# Patient Record
Sex: Male | Born: 1990
Health system: Southern US, Community
[De-identification: ages and names within clinical notes are randomized; demographics above are authoritative.]

## PROBLEM LIST (undated history)

## (undated) HISTORY — PX: TONSILLECTOMY: SUR1361

---

## 2013-08-16 ENCOUNTER — Emergency Department (HOSPITAL_BASED_OUTPATIENT_CLINIC_OR_DEPARTMENT_OTHER): Payer: Self-pay

## 2013-08-16 ENCOUNTER — Emergency Department (HOSPITAL_BASED_OUTPATIENT_CLINIC_OR_DEPARTMENT_OTHER)
Admission: EM | Admit: 2013-08-16 | Discharge: 2013-08-16 | Disposition: A | Discharge status: Home / Self Care | Payer: Self-pay | Attending: Emergency Medicine | Admitting: Emergency Medicine

## 2013-08-16 ENCOUNTER — Encounter (HOSPITAL_BASED_OUTPATIENT_CLINIC_OR_DEPARTMENT_OTHER): Payer: Self-pay | Admitting: Emergency Medicine

## 2013-08-16 DIAGNOSIS — N50812 Left testicular pain: Secondary | ICD-10-CM

## 2013-08-16 DIAGNOSIS — K5289 Other specified noninfective gastroenteritis and colitis: Secondary | ICD-10-CM | POA: Insufficient documentation

## 2013-08-16 DIAGNOSIS — K6289 Other specified diseases of anus and rectum: Secondary | ICD-10-CM | POA: Insufficient documentation

## 2013-08-16 DIAGNOSIS — K529 Noninfective gastroenteritis and colitis, unspecified: Secondary | ICD-10-CM

## 2013-08-16 LAB — COMPREHENSIVE METABOLIC PANEL
ALT: 17 U/L (ref 0–53)
AST: 19 U/L (ref 0–37)
Albumin: 4.2 g/dL (ref 3.5–5.2)
Alkaline Phosphatase: 48 U/L (ref 39–117)
Anion gap: 17 — ABNORMAL HIGH (ref 5–15)
BILIRUBIN TOTAL: 0.3 mg/dL (ref 0.3–1.2)
BUN: 7 mg/dL (ref 6–23)
CHLORIDE: 98 meq/L (ref 96–112)
CO2: 26 meq/L (ref 19–32)
CREATININE: 0.7 mg/dL (ref 0.50–1.35)
Calcium: 9.9 mg/dL (ref 8.4–10.5)
GFR calc Af Amer: >90 mL/min (ref >90)
Glucose, Bld: 130 mg/dL — ABNORMAL HIGH (ref 70–99)
Potassium: 4 mEq/L (ref 3.7–5.3)
Sodium: 141 mEq/L (ref 137–147)
Total Protein: 7.8 g/dL (ref 6.0–8.3)

## 2013-08-16 LAB — CBC WITH DIFFERENTIAL/PLATELET
Basophils Absolute: 0 10*3/uL (ref 0.0–0.1)
Basophils Relative: 0 % (ref 0–1)
Eosinophils Absolute: 0 10*3/uL (ref 0.0–0.7)
Eosinophils Relative: 0 % (ref 0–5)
HEMATOCRIT: 43.2 % (ref 39.0–52.0)
HEMOGLOBIN: 14.4 g/dL (ref 13.0–17.0)
LYMPHS ABS: 1.3 10*3/uL (ref 0.7–4.0)
LYMPHS PCT: 17 % (ref 12–46)
MCH: 30.8 pg (ref 26.0–34.0)
MCHC: 33.3 g/dL (ref 30.0–36.0)
MCV: 92.3 fL (ref 78.0–100.0)
MONO ABS: 1 10*3/uL (ref 0.1–1.0)
Monocytes Relative: 13 % — ABNORMAL HIGH (ref 3–12)
Neutro Abs: 5.3 10*3/uL (ref 1.7–7.7)
Neutrophils Relative %: 69 % (ref 43–77)
Platelets: 161 10*3/uL (ref 150–400)
RBC: 4.68 MIL/uL (ref 4.22–5.81)
RDW: 12.6 % (ref 11.5–15.5)
WBC: 7.7 10*3/uL (ref 4.0–10.5)

## 2013-08-16 LAB — URINALYSIS, ROUTINE W REFLEX MICROSCOPIC
Bilirubin Urine: NEGATIVE
Glucose, UA: NEGATIVE mg/dL
HGB URINE DIPSTICK: NEGATIVE
Ketones, ur: NEGATIVE mg/dL
Leukocytes, UA: NEGATIVE
Nitrite: NEGATIVE
PH: 8.5 — AB (ref 5.0–8.0)
PROTEIN: NEGATIVE mg/dL
Specific Gravity, Urine: 1.006 (ref 1.005–1.030)
Urobilinogen, UA: 0.2 mg/dL (ref 0.0–1.0)

## 2013-08-16 LAB — I-STAT CG4 LACTIC ACID, ED: Lactic Acid, Venous: 1.78 mmol/L (ref 0.5–2.2)

## 2013-08-16 LAB — LIPASE, BLOOD: Lipase: 33 U/L (ref 11–59)

## 2013-08-16 MED ORDER — SODIUM CHLORIDE 0.9 % IV BOLUS (SEPSIS)
1000.0000 mL | Freq: Once | INTRAVENOUS | Status: AC
  Administered 2013-08-16: 1000 mL via INTRAVENOUS

## 2013-08-16 MED ORDER — MORPHINE SULFATE 4 MG/ML IJ SOLN
4.0000 mg | Freq: Once | INTRAMUSCULAR | Status: AC
  Administered 2013-08-16: 4 mg via INTRAVENOUS
  Filled 2013-08-16: qty 1

## 2013-08-16 MED ORDER — HYDROMORPHONE HCL PF 1 MG/ML IJ SOLN
0.5000 mg | Freq: Once | INTRAMUSCULAR | Status: AC
  Administered 2013-08-16: 0.5 mg via INTRAVENOUS
  Filled 2013-08-16: qty 1

## 2013-08-16 MED ORDER — IOHEXOL 300 MG/ML  SOLN
100.0000 mL | Freq: Once | INTRAMUSCULAR | Status: AC | PRN
  Administered 2013-08-16: 100 mL via INTRAVENOUS

## 2013-08-16 MED ORDER — DOCUSATE SODIUM 100 MG PO CAPS: 100.0000 mg | ORAL_CAPSULE | Freq: Two times a day (BID) | ORAL | Status: DC

## 2013-08-16 MED ORDER — METRONIDAZOLE 500 MG PO TABS: 500.0000 mg | ORAL_TABLET | Freq: Two times a day (BID) | ORAL | Status: DC

## 2013-08-16 MED ORDER — OXYCODONE-ACETAMINOPHEN 5-325 MG PO TABS: 1.0000 | ORAL_TABLET | Freq: Four times a day (QID) | ORAL | Status: DC | PRN

## 2013-08-16 MED ORDER — CIPROFLOXACIN HCL 500 MG PO TABS: 500.0000 mg | ORAL_TABLET | Freq: Two times a day (BID) | ORAL | Status: DC

## 2013-08-16 MED ORDER — IOHEXOL 300 MG/ML  SOLN
50.0000 mL | Freq: Once | INTRAMUSCULAR | Status: AC | PRN
  Administered 2013-08-16: 50 mL via ORAL

## 2013-08-16 MED ORDER — ONDANSETRON HCL 4 MG/2ML IJ SOLN
4.0000 mg | Freq: Once | INTRAMUSCULAR | Status: AC
  Administered 2013-08-16: 4 mg via INTRAVENOUS
  Filled 2013-08-16: qty 2

## 2013-08-16 MED ORDER — PROMETHAZINE HCL 25 MG PO TABS: 25.0000 mg | ORAL_TABLET | Freq: Four times a day (QID) | ORAL | Status: DC | PRN

## 2013-08-16 NOTE — ED Notes (Signed)
Pt requesting something for "the shakes." Informed pt that he would have to wait on that medication. Pt transported to [facility] at this time.

## 2013-08-16 NOTE — ED Notes (Signed)
Pt sts that pain is 0/10 with no movement but 5/10 with movement.

## 2013-08-16 NOTE — ED Notes (Signed)
Reports fever since Sunday. Sts left testicle pain with some swelling. Some n/v. Last BM was Monday, diarrhea.

## 2013-08-16 NOTE — ED Provider Notes (Signed)
TIME SEEN: 9:22 AM  CHIEF COMPLAINT: Abdominal pain, testicular pain, nausea, diarrhea, fever  HPI: Patient is a 23 year old male with a significant past medical history and no history of prior abdominal surgeries who presents the emergency department with fever as high as 103 for 4 days, nausea and diarrhea. Denies vomiting.  He states 2 days ago he started having pain in his left testicle and perineum. He feels that the area is swollen. Denies a history of trauma to this area. He has had pain with bowel movements.  No bloody stool or melena. No dysuria or hematuria. No penile discharge. He is sexual active with one partner. Denies concern for STDs. No recent travel, antibiotic use or hospitalization. No sick contacts. No history of inflammatory bowel disease or family history of the same.  ROS: See HPI Constitutional:  fever  Eyes: no drainage  ENT: no runny nose   Cardiovascular:  no chest pain  Resp: no SOB  GI: no vomiting GU: no dysuria Integumentary: no rash  Allergy: no hives  Musculoskeletal: no leg swelling  Neurological: no slurred speech ROS otherwise negative  PAST MEDICAL HISTORY/PAST SURGICAL HISTORY:  History reviewed. No pertinent past medical history.  MEDICATIONS:  Prior to Admission medications   Not on File    ALLERGIES:  No Known Allergies  SOCIAL HISTORY:  History  Substance Use Topics  . Smoking status: Never Smoker   . Smokeless tobacco: Not on file  . Alcohol Use: Yes     Comment: occ    FAMILY HISTORY: No family history on file.  EXAM: BP 144/83  Pulse 65  Temp(Src) 98 F (36.7 C) (Oral)  Resp 18  Ht 6\' 2"  (1.88 m)  Wt 170 lb (77.111 kg)  BMI 21.82 kg/m2  SpO2 100% CONSTITUTIONAL: Alert and oriented and responds appropriately to questions. Well-appearing; well-nourished, patient is nontoxic but does appear very uncomfortable HEAD: Normocephalic EYES: Conjunctivae clear, PERRL ENT: normal nose; no rhinorrhea; moist mucous membranes;  pharynx without lesions noted NECK: Supple, no meningismus, no LAD  CARD: RRR; S1 and S2 appreciated; no murmurs, no clicks, no rubs, no gallops RESP: Normal chest excursion without splinting or tachypnea; breath sounds clear and equal bilaterally; no wheezes, no rhonchi, no rales,  ABD/GI: Normal bowel sounds; non-distended; soft, diffusely tender to palpation with voluntary guarding diffusely, no rebound, most tenderness is in the lower abdomen but no tenderness at McBurney's point that is not significant compared to the rest of the abdomen, no peritoneal signs GU:  No testicular swelling or masses, patient does have mild pain over his left testicle but most of his pain is in the perineum without obvious  swelling or induration or warmth or crepitus, no scrotal masses, no hernia, 2+ femoral pulses bilaterally, no penile discharge BACK:  The back appears normal and is non-tender to palpation, there is no CVA tenderness EXT: Normal ROM in all joints; non-tender to palpation; no edema; normal capillary refill; no cyanosis    SKIN: Normal color for age and race; warm NEURO: Moves all extremities equally PSYCH: The patient's mood and manner are appropriate. Grooming and personal hygiene are appropriate.  MEDICAL DECISION MAKING: Patient here with fever, abdominal pain, nausea, diarrhea and perineal pain. Differential diagnosis includes colitis, abscess, viral gastroenteritis, UTI, less likely torsion, epididymitis, less likely Fournier's gangrene. Given he is tender to palpation diffusely throughout his abdomen and perineum, I feel he needs a CT scan for further evaluation. Given he will need prolonged time to drink contrast to  fully evaluate his bowel, will obtain a scrotal ultrasound first to rule out torsion given there is some left testicular pain although less likely given most of his pain is in his abdomen and perineum. We'll check abdominal labs, urine. Will give pain medication, nausea medicine and  IV fluids.  ED PROGRESS: Patient's labs, urine and testicular ultrasound are negative other than bilateral varicoceles. He is still complaining of pain in his perineum but this is improved after morphine and Dilaudid.  Discussed with patient that given work up so far has been negative, his CT may also be normal and this may be viral gastroenteritis but given he is still having significant abdominal pain and perineal pain on exam and that he has not required 3 doses of narcotics to control his pain,  I plan to still proceed with a CT scan.   12:24 PM  Pt's CT scan shows sigmoid colitis and proctitis. Given he has had fever, will treat as if this is infectious with Cipro and Flagyl for 10 days. Have discussed with him that he will likely need a colonoscopy for further evaluation once his symptoms have resolved. We'll discharge with low power and equal gastroenterology followup urination. Given his pain is well controlled currently and he is hemodynamically stable with normal labs and has been able to tolerate by mouth, I feel he is safe to be discharged home on oral antibiotics. We'll also discharge with Percocet, Zofran and Colace. Patient and her significant other bedside are comfortable with this plan and verbalized understanding. Have discussed return precautions.  Layla Maw Sahithi Ordoyne, DO 08/16/13 1226

## 2013-08-16 NOTE — Discharge Instructions (Signed)
Colitis Colitis is inflammation of the colon. Colitis can be a short-term or long-standing (chronic) illness. Crohn's disease and ulcerative colitis are 2 types of colitis which are chronic. They usually require lifelong treatment. CAUSES  There are many different causes of colitis, including:  Viruses.  Germs (bacteria).  Medicine reactions. SYMPTOMS   Diarrhea.  Intestinal bleeding.  Pain.  Fever.  Throwing up (vomiting).  Tiredness (fatigue).  Weight loss.  Bowel blockage. DIAGNOSIS  The diagnosis of colitis is based on examination and stool or blood tests. X-rays, CT scan, and colonoscopy may also be needed. TREATMENT  Treatment may include:  Fluids given through the vein (intravenously).  Bowel rest (nothing to eat or drink for a period of time).  Medicine for pain and diarrhea.  Medicines (antibiotics) that kill germs.  Cortisone medicines.  Surgery. HOME CARE INSTRUCTIONS   Get plenty of rest.  Drink enough water and fluids to keep your urine clear or pale yellow.  Eat a well-balanced diet.  Call your caregiver for follow-up as recommended. SEEK IMMEDIATE MEDICAL CARE IF:   You develop chills.  You have an oral temperature above 102 F (38.9 C), not controlled by medicine.  You have extreme weakness, fainting, or dehydration.  You have repeated vomiting.  You develop severe belly (abdominal) pain or are passing bloody or tarry stools. MAKE SURE YOU:   Understand these instructions.  Will watch your condition.  Will get help right away if you are not doing well or get worse. Document Released: 02/19/2004 Document Revised: 04/05/2011 Document Reviewed: 05/16/2009 [facility] Patient Information 2015 [place], [place]. This information is not intended to replace advice given to you by your health care provider. Make sure you discuss any questions you have with your health care provider.  Proctitis Proctitis is the swelling and soreness  (inflammation) of the lining of the rectum. The rectum is at the end of the large intestine and is attached to the anus. The inflammation causes pain and discomfort. It may be short-term (acute) or long-lasting (chronic). CAUSES Inflammation in the rectum can be caused by many things, such as:  Sexually transmitted diseases (STDs).  Infection.  Anal-rectal trauma or injury.  Ulcerative colitis or Crohn's disease.  Radiation therapy directed near the rectum.  Antibiotic therapy. SYMPTOMS  Sudden, uncomfortable, and frequent urge to have a bowel movement.  Anal or rectal pain.  Abdominal cramping or pain.  Sensation that the rectum is full.  Rectal bleeding.  Pus or mucus discharge from anus.  Diarrhea or frequent soft, loose stools. DIAGNOSIS Diagnosis may include the following:  A history and physical exam.  An STD test.  Blood tests.  Stool tests.  Rectal culture.  A procedure to evaluate the anal canal (anoscopy).  Procedures to look at part, or the entire large bowel (sigmoidoscopy, colonoscopy). TREATMENT Treatment of proctitis depends on the cause. Reducing the symptoms of inflammation and eliminating infection are the main goals of treatment. Treatment may include:  Home remedies and lifestyle, such as sitz baths and avoiding food right before bedtime.  Topical ointments, foams, suppositories, or enemas, such as corticosteroids or anti-inflammatories.  Antibiotic or antiviral medicines to treat infection or to control harmful bacteria.  Medicines to control diarrhea, soften stools, and reduce pain.  Medicines to suppress the immune system.  Avoiding the activity that caused rectal trauma.  Nutritional, dietary, or herbal supplements.  Heat or laser therapy for persistent bleeding.  A dilation procedure to enlarge a narrowed rectum.  Surgery, though rare, may  be necessary to repair damaged rectal lining. HOME CARE INSTRUCTIONS Only take  medicines that are recommended or approved by your caregiver.Do not take anti-diarrhea medicine without your caregiver's approval. SEEK MEDICAL CARE IF:  You often experience one or more of the symptoms noted above.  You keep experiencing symptoms after treatment.  You have questions or concerns about your symptoms or treatment plan. MAKE SURE YOU:  Understand these instructions.  Will watch your condition.  Will get help right away if you are not doing well or get worse. FOR MORE INFORMATION National Institute of Diabetes and Digestive and Kidney Disease (NIDDK): www.digestive.https://bradley.com/niddk.hih.gov/ Document Released: 12/31/2010 Document Revised: 05/08/2012 Document Reviewed: 12/31/2010 [facility] Patient Information 2015 [place]. This information is not intended to replace advice given to you by your health care provider. Make sure you discuss any questions you have with your health care provider.

## 2015-04-11 IMAGING — CT CT ABD-PELV W/ CM
2 of 4 series · 15 of 46 positions shown, 17 images · IV contrast (APPLIED)
Comparison: No prior CT. Scrotal ultrasound performed earlier same
date is correlated.

CLINICAL DATA: Abdominal pain and pain localizing to the perineum
for the past 2 days. Fever. Nausea.

EXAM:
CT ABDOMEN AND PELVIS WITH CONTRAST
TECHNIQUE: Multidetector CT imaging of the abdomen and pelvis was performed
using the standard protocol following bolus administration of
intravenous contrast.
CONTRAST:  100mL OMNIPAQUE IOHEXOL 300 MG/ML IV. Oral contrast was
also administered.

[Series 2: abd/pelvis 5.0 b31f · axial · 0.75mm/px · z∈[-582,-72]mm · 12 of 112 slices shown, 14 images]
[im 5/112  soft-tissue]
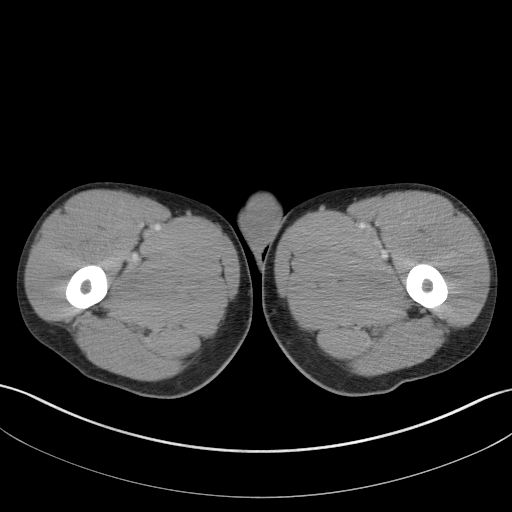
[im 5/112  bone]
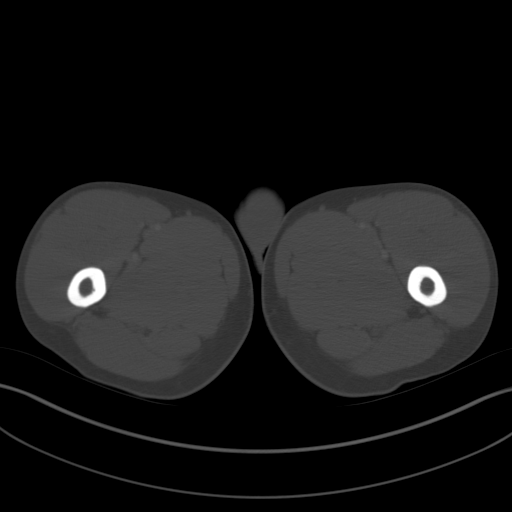
[im 14/112  soft-tissue]
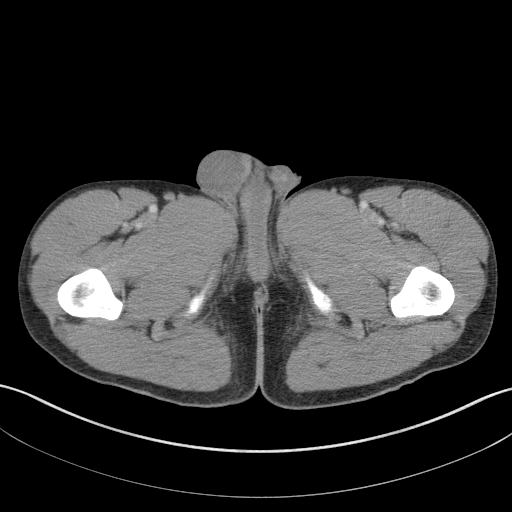
[im 24/112  soft-tissue]
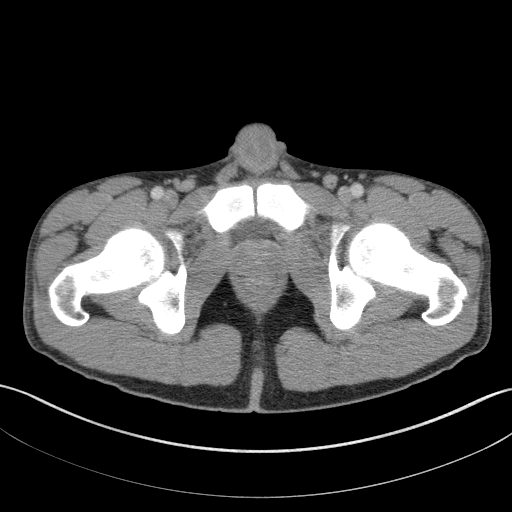
[im 33/112  soft-tissue]
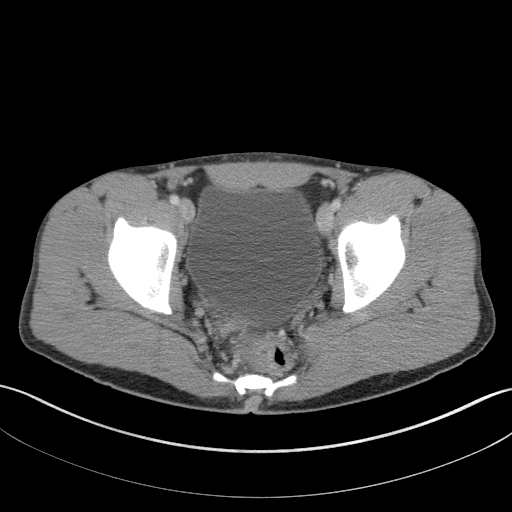
[im 42/112  soft-tissue]
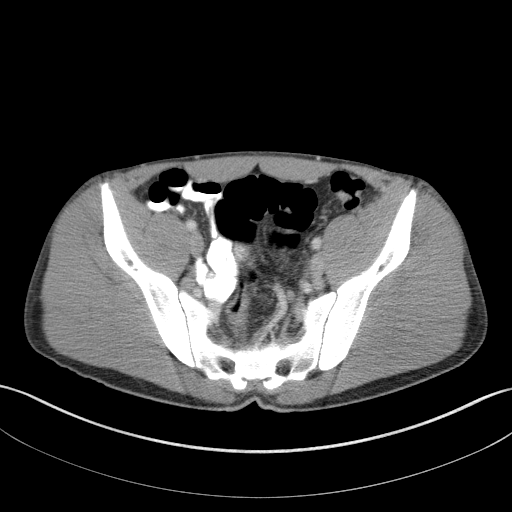
[im 51/112  soft-tissue]
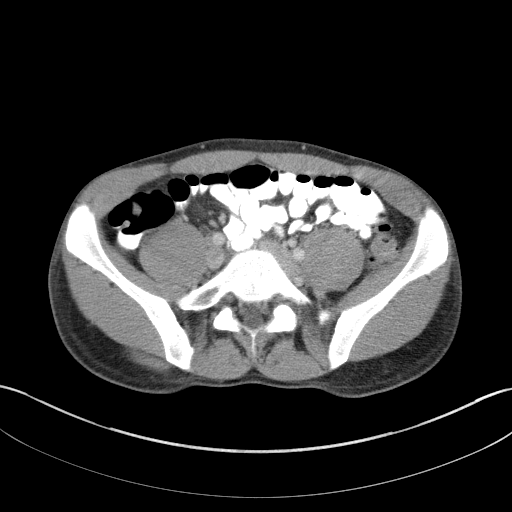
[im 61/112  soft-tissue]
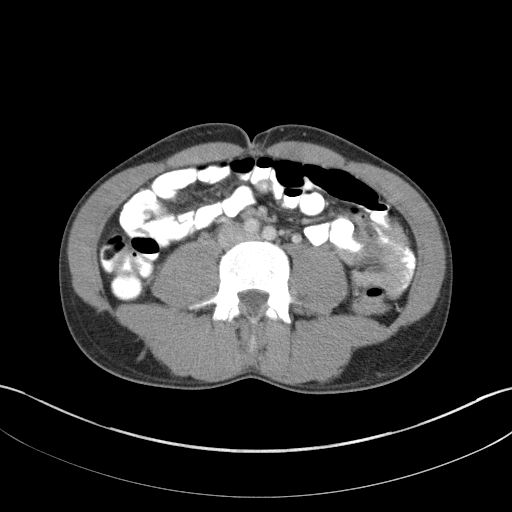
[im 70/112  soft-tissue]
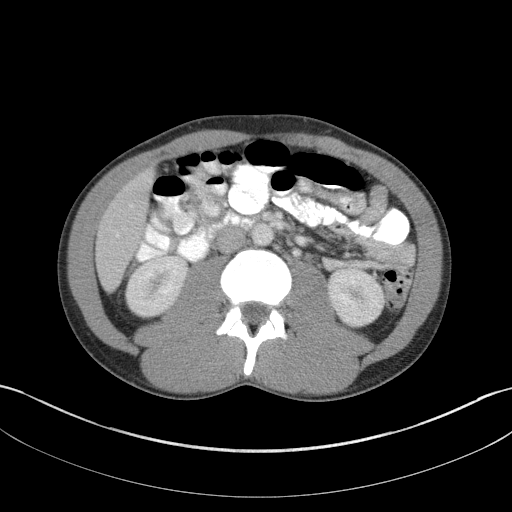
[im 79/112  soft-tissue]
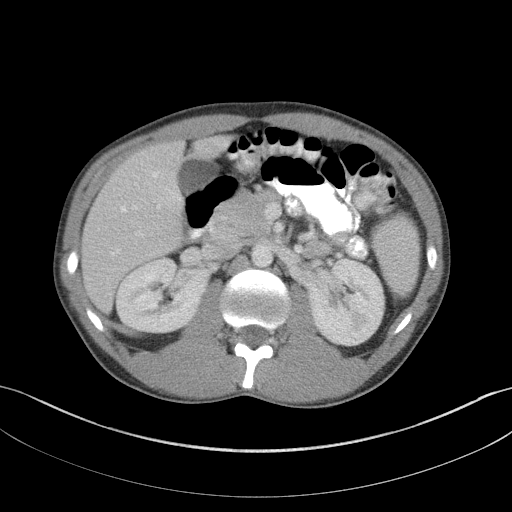
[im 79/112  bone]
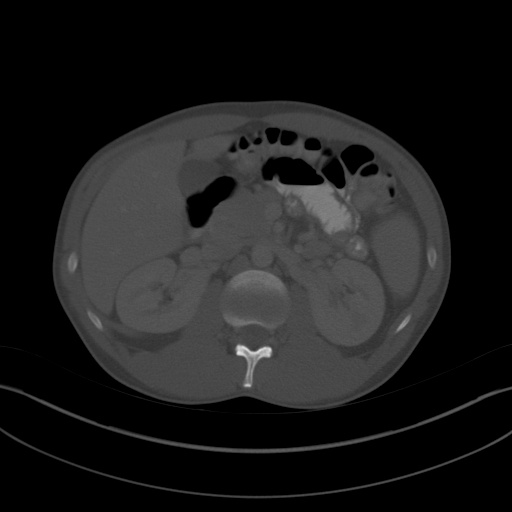
[im 88/112  soft-tissue]
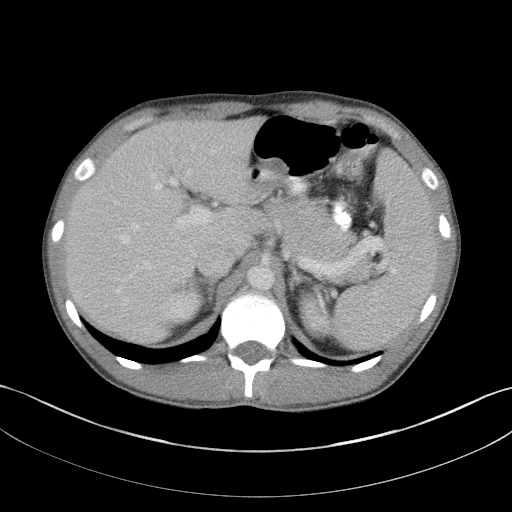
[im 98/112  soft-tissue]
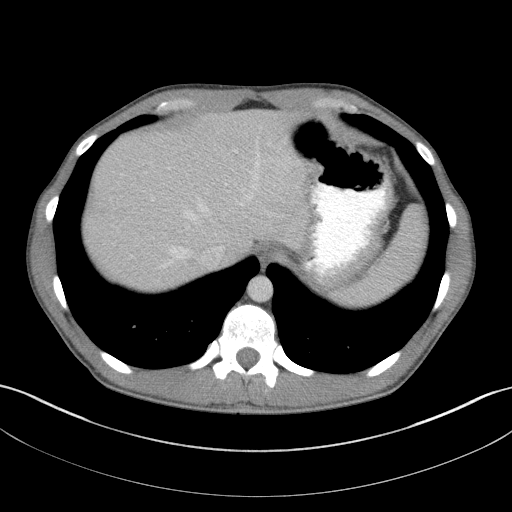
[im 107/112  soft-tissue]
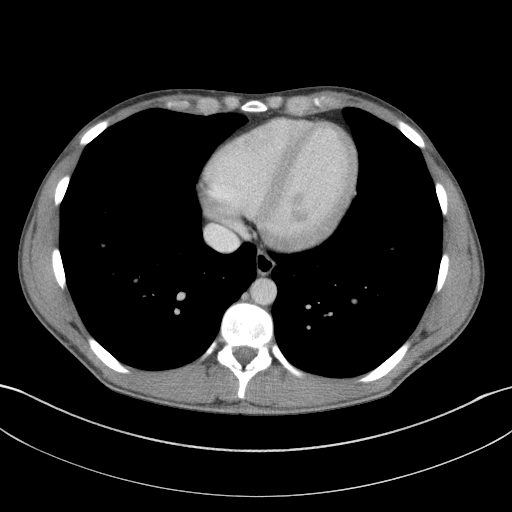

[Series 5: abd/pelvis 3.0 coronal · coronal · 0.85mm/px · 3 of 82 slices shown]
[im 28/82  soft-tissue]
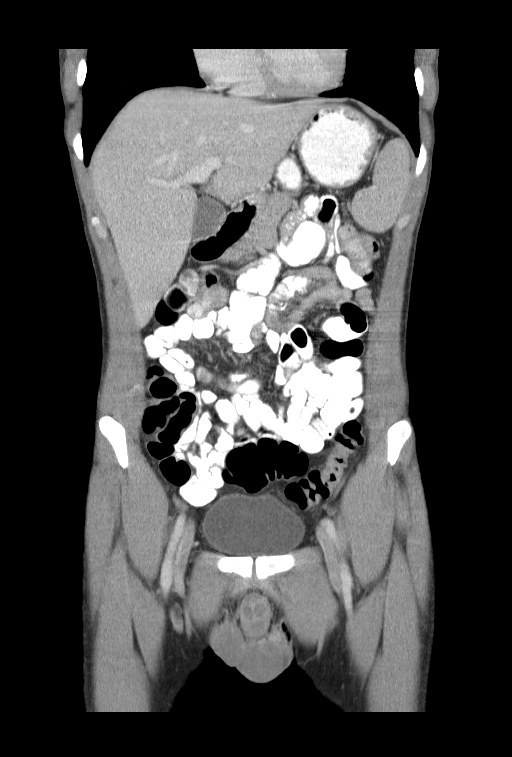
[im 37/82  soft-tissue]
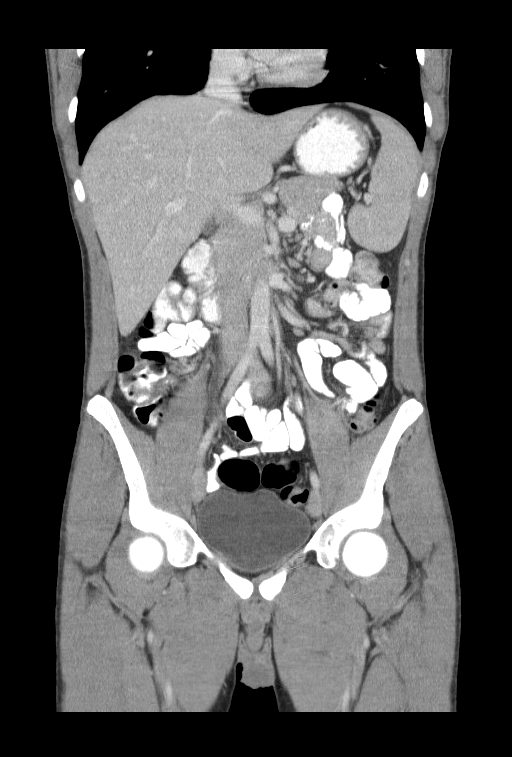
[im 46/82  soft-tissue]
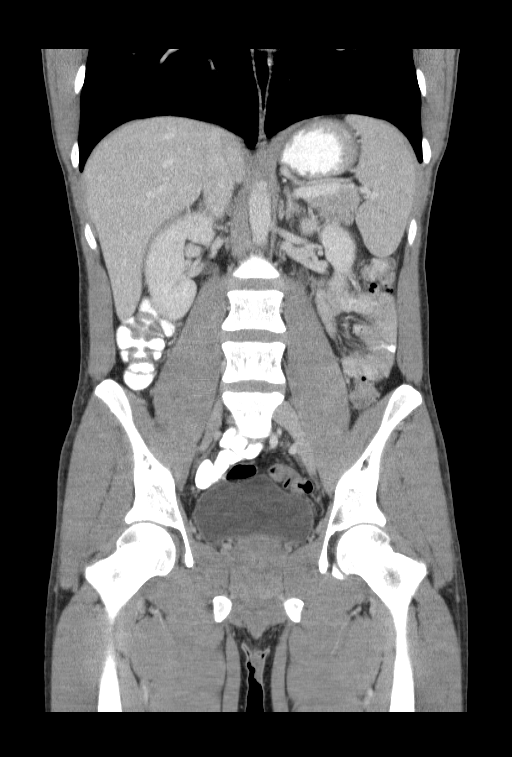

[15 of 46 positions shown; findings below may reference images not displayed]

FINDINGS: Normal appendix in the right upper pelvis, filled with oral
contrast. Edema/thickening of the wall of the entire rectum and the
distal sigmoid colon without evidence of obstruction. Remainder of
the colon relatively decompressed and normal in appearance,
containing liquid stool. Normal-appearing small bowel. Normal
appearing stomach. No ascites. No abscess.

Normal appearing liver, spleen, pancreas, adrenal glands, kidneys,
and gallbladder. 2 adjacent foci of accessory splenic tissue
posterior to the lower pole of the spleen. No biliary ductal
dilation. Normal-appearing abdominal aorta and iliofemoral arteries.
Patent visceral arteries. Enlarged inferior mesenteric vein with
probable internal hemorrhoids involving the rectum. No significant
lymphadenopathy.

Urinary bladder normal in appearance. Prostate gland and seminal
vesicles normal for age.

Bone window images demonstrate and assimilation joint between the
right transverse process of L5 and the 1st sacral segment, with
early degenerative changes. Otherwise, bone window images
unremarkable. Visualized lung bases clear. Heart size normal.
IMPRESSION: 1. Proctitis and distal sigmoid colitis. Probable internal
hemorrhoids involving the rectum. The remainder of the
gastrointestinal tract is normal in appearance.
2. No acute or significant abnormalities otherwise.

## 2015-04-30 ENCOUNTER — Encounter (HOSPITAL_BASED_OUTPATIENT_CLINIC_OR_DEPARTMENT_OTHER): Payer: Self-pay

## 2015-04-30 ENCOUNTER — Emergency Department (HOSPITAL_BASED_OUTPATIENT_CLINIC_OR_DEPARTMENT_OTHER)
Admission: EM | Admit: 2015-04-30 | Discharge: 2015-04-30 | Disposition: A | Discharge status: Home / Self Care | Payer: Self-pay | Source: Home / Self Care | Attending: Emergency Medicine | Admitting: Emergency Medicine

## 2015-04-30 DIAGNOSIS — M62838 Other muscle spasm: Secondary | ICD-10-CM

## 2015-04-30 DIAGNOSIS — M542 Cervicalgia: Secondary | ICD-10-CM

## 2015-04-30 DIAGNOSIS — F17210 Nicotine dependence, cigarettes, uncomplicated: Secondary | ICD-10-CM | POA: Insufficient documentation

## 2015-04-30 DIAGNOSIS — M25511 Pain in right shoulder: Secondary | ICD-10-CM | POA: Insufficient documentation

## 2015-04-30 MED ORDER — CYCLOBENZAPRINE HCL 10 MG PO TABS: 10.0000 mg | ORAL_TABLET | Freq: Two times a day (BID) | ORAL | Status: AC | PRN

## 2015-04-30 MED ORDER — NAPROXEN 250 MG PO TABS
ORAL_TABLET | ORAL | Status: AC
  Filled 2015-04-30: qty 2

## 2015-04-30 MED ORDER — NAPROXEN 500 MG PO TABS
500.0000 mg | ORAL_TABLET | Freq: Two times a day (BID) | ORAL | Status: AC
Start: 2015-04-30 — End: ?

## 2015-04-30 MED ORDER — NAPROXEN 250 MG PO TABS
500.0000 mg | ORAL_TABLET | Freq: Once | ORAL | Status: AC
  Administered 2015-04-30: 500 mg via ORAL

## 2015-04-30 NOTE — ED Notes (Signed)
Woke approx 1 week ago with posterior neck pain-denies injury-NAD

## 2015-04-30 NOTE — Discharge Instructions (Signed)
Heat Therapy °Heat therapy can help ease sore, stiff, injured, and tight muscles and joints. Heat relaxes your muscles, which may help ease your pain.  °RISKS AND COMPLICATIONS °If you have any of the following conditions, do not use heat therapy unless your health care provider has approved: °· Poor circulation. °· Healing wounds or scarred skin in the area being treated. °· Diabetes, heart disease, or high blood pressure. °· Not being able to feel (numbness) the area being treated. °· Unusual swelling of the area being treated. °· Active infections. °· Blood clots. °· Cancer. °· Inability to communicate pain. This may include young children and people who have problems with their brain function (dementia). °· Pregnancy. °Heat therapy should only be used on old, pre-existing, or long-lasting (chronic) injuries. Do not use heat therapy on new injuries unless directed by your health care provider. °HOW TO USE HEAT THERAPY °There are several different kinds of heat therapy, including: °· Moist heat pack. °· Warm water bath. °· Hot water bottle. °· Electric heating pad. °· Heated gel pack. °· Heated wrap. °· Electric heating pad. °Use the heat therapy method suggested by your health care provider. Follow your health care provider's instructions on when and how to use heat therapy. °GENERAL HEAT THERAPY RECOMMENDATIONS °· Do not sleep while using heat therapy. Only use heat therapy while you are awake. °· Your skin may turn pink while using heat therapy. Do not use heat therapy if your skin turns red. °· Do not use heat therapy if you have new pain. °· High heat or long exposure to heat can cause burns. Be careful when using heat therapy to avoid burning your skin. °· Do not use heat therapy on areas of your skin that are already irritated, such as with a rash or sunburn. °SEEK MEDICAL CARE IF: °· You have blisters, redness, swelling, or numbness. °· You have new pain. °· Your pain is worse. °MAKE SURE  YOU: °· Understand these instructions. °· Will watch your condition. °· Will get help right away if you are not doing well or get worse. °  °This information is not intended to replace advice given to you by your health care provider. Make sure you discuss any questions you have with your health care provider. °  °Document Released: 04/05/2011 Document Revised: 02/01/2014 Document Reviewed: 03/06/2013 °Elsevier Interactive Patient Education ©2016 [employer] Inc[employer] °Muscle Strain °A muscle strain is an injury that occurs when a muscle is stretched beyond its normal length. Usually a small number of muscle fibers are torn when this happens. Muscle strain is rated in degrees. First-degree strains have the least amount of muscle fiber tearing and pain. Second-degree and third-degree strains have increasingly more tearing and pain.  °Usually, recovery from muscle strain takes 1-2 weeks. Complete healing takes 5-6 weeks.  °CAUSES  °Muscle strain happens when a sudden, violent force placed on a muscle stretches it too far. This may occur with lifting, sports, or a fall.  °RISK FACTORS °Muscle strain is especially common in athletes.  °SIGNS AND SYMPTOMS °At the site of the muscle strain, there may be: °· Pain. °· Bruising. °· Swelling. °· Difficulty using the muscle due to pain or lack of normal function. °DIAGNOSIS  °Your health care provider will perform a physical exam and ask about your medical history. °TREATMENT  °Often, the best treatment for a muscle strain is resting, icing, and applying cold compresses to the injured area.   °HOME CARE INSTRUCTIONS  °· Use the PRICE method   of treatment to promote muscle healing during the first 2-3 days after your injury. The PRICE method involves: °· Protecting the muscle from being injured again. °· Restricting your activity and resting the injured body part. °· Icing your injury. To do this, put ice in a plastic bag. Place a towel between your skin and the bag. Then, apply the ice  and leave it on from 15-20 minutes each hour. After the third day, switch to moist heat packs. °· Apply compression to the injured area with a splint or elastic bandage. Be careful not to wrap it too tightly. This may interfere with blood circulation or increase swelling. °· Elevate the injured body part above the level of your heart as often as you can. °· Only take over-the-counter or prescription medicines for pain, discomfort, or fever as directed by your health care provider. °· Warming up prior to exercise helps to prevent future muscle strains. °SEEK MEDICAL CARE IF:  °· You have increasing pain or swelling in the injured area. °· You have numbness, tingling, or a significant loss of strength in the injured area. °MAKE SURE YOU:  °· Understand these instructions. °· Will watch your condition. °· Will get help right away if you are not doing well or get worse. °  °This information is not intended to replace advice given to you by your health care provider. Make sure you discuss any questions you have with your health care provider. °  °Document Released: 01/11/2005 Document Revised: 11/01/2012 Document Reviewed: 08/10/2012 °Elsevier Interactive Patient Education ©2016 Elsevier Inc. ° °

## 2015-04-30 NOTE — ED Notes (Signed)
MD at bedside. 

## 2015-05-01 ENCOUNTER — Encounter (HOSPITAL_BASED_OUTPATIENT_CLINIC_OR_DEPARTMENT_OTHER): Payer: Self-pay | Admitting: *Deleted

## 2015-05-01 ENCOUNTER — Emergency Department (HOSPITAL_BASED_OUTPATIENT_CLINIC_OR_DEPARTMENT_OTHER)
Admission: EM | Admit: 2015-05-01 | Discharge: 2015-05-01 | Disposition: A | Discharge status: Home / Self Care | Payer: Self-pay | Attending: Emergency Medicine | Admitting: Emergency Medicine

## 2015-05-01 DIAGNOSIS — F17210 Nicotine dependence, cigarettes, uncomplicated: Secondary | ICD-10-CM | POA: Insufficient documentation

## 2015-05-01 DIAGNOSIS — M5412 Radiculopathy, cervical region: Secondary | ICD-10-CM | POA: Insufficient documentation

## 2015-05-01 MED ORDER — MORPHINE SULFATE (PF) 4 MG/ML IV SOLN
6.0000 mg | Freq: Once | INTRAVENOUS | Status: AC
  Administered 2015-05-01: 6 mg via INTRAVENOUS
  Filled 2015-05-01: qty 2

## 2015-05-01 MED ORDER — HYDROCODONE-ACETAMINOPHEN 5-325 MG PO TABS: 1.0000 | ORAL_TABLET | ORAL | Status: DC | PRN

## 2015-05-01 MED FILL — HYDROCODON-APAP 5-325: 5-325 | 2 days supply | Qty: 20 | Fill #0

## 2015-05-01 NOTE — Discharge Instructions (Signed)
Cervical Radiculopathy You can continue to take the naproxen prescribed to you for mild pain or take the medication prescribed today for bad pain. You can also take the Flexeril(cyclobenzaprine) for muscle spasm. Where the soft cervical collar as needed for comfort .Don't drive or operate machinery while taking the medications prescribed as they can cause drowsiness. An MRI scan of your neck has been ordered for 05/03/2015 at 2:30 AM at this building. Get the study the result will be shown to the emergency doctor working, and you'll be advised further as far as treatment or specialist needed, if any Cervical radiculopathy happens when a nerve in the neck (cervical nerve) is pinched or bruised. This condition can develop because of an injury or as part of the normal aging process. Pressure on the cervical nerves can cause pain or numbness that runs from the neck all the way down into the arm and fingers. Usually, this condition gets better with rest. Treatment may be needed if the condition does not improve.  CAUSES This condition may be caused by:  Injury.  Slipped (herniated) disk.  Muscle tightness in the neck because of overuse.  Arthritis.  Breakdown or degeneration in the bones and joints of the spine (spondylosis) due to aging.  Bone spurs that may develop near the cervical nerves. SYMPTOMS Symptoms of this condition include:  Pain that runs from the neck to the arm and hand. The pain can be severe or irritating. It may be worse when the neck is moved.  Numbness or weakness in the affected arm and hand. DIAGNOSIS This condition may be diagnosed based on symptoms, medical history, and a physical exam. You may also have tests, including:  X-rays.  CT scan.  MRI.  Electromyogram (EMG).  Nerve conduction tests. TREATMENT In many cases, treatment is not needed for this condition. With rest, the condition usually gets better over time. If treatment is needed, options may  include:  Wearing a soft neck collar for short periods of time.  Physical therapy to strengthen your neck muscles.  Medicines, such as NSAIDs, oral corticosteroids, or spinal injections.  Surgery. This may be needed if other treatments do not help. Various types of surgery may be done depending on the cause of your problems. HOME CARE INSTRUCTIONS Managing Pain  Take over-the-counter and prescription medicines only as told by your health care provider.  If directed, apply ice to the affected area.  Put ice in a plastic bag.  Place a towel between your skin and the bag.  Leave the ice on for 20 minutes, 2-3 times per day.  If ice does not help, you can try using heat. Take a warm shower or warm bath, or use a heat pack as told by your health care provider.  Try a gentle neck and shoulder massage to help relieve symptoms. Activity  Rest as needed. Follow instructions from your health care provider about any restrictions on activities.  Do stretching and strengthening exercises as told by your health care provider or physical therapist. General Instructions  If you were given a soft collar, wear it as told by your health care provider.  Use a flat pillow when you sleep.  Keep all follow-up visits as told by your health care provider. This is important. SEEK MEDICAL CARE IF:  Your condition does not improve with treatment. SEEK IMMEDIATE MEDICAL CARE IF:  Your pain gets much worse and cannot be controlled with medicines.  You have weakness or numbness in your hand, arm, face,  or leg.  You have a high fever.  You have a stiff, rigid neck.  You lose control of your bowels or your bladder (have incontinence).  You have trouble with walking, balance, or speaking.   This information is not intended to replace advice given to you by your health care provider. Make sure you discuss any questions you have with your health care provider.   Document Released: 10/06/2000  Document Revised: 10/02/2014 Document Reviewed: 03/07/2014 Elsevier Interactive Patient Education [employer]2016 [employer].

## 2015-05-01 NOTE — ED Provider Notes (Signed)
CSN: [id number]     Arrival date & time 05/01/15  1016 History   First MD Initiated Contact with Patient 05/01/15 1044     Chief Complaint  Patient presents with  . Neck Pain     (Consider location/radiation/quality/duration/timing/severity/associated sxs/prior Treatment) HPI Complains of right-sided posterolateral neck pain radiating to right upper arm onset yesterday, gradual onset yesterday, pain is worse with rotating his neck. No loss of bladder or bowel control no chest pain. No other associated symptoms. Treated with Flexeril and Naprosyn without relief. No fever. No pain on swallowing no dysphagia History reviewed. No pertinent past medical history. Past Surgical History  Procedure Laterality Date  . Tonsillectomy     No family history on file. Social History  Substance Use Topics  . Smoking status: Current Every Day Smoker    Types: Cigarettes  . Smokeless tobacco: Never Used  . Alcohol Use: Yes     Comment: beer 1 x week    no illicit drug use Review of Systems  Musculoskeletal: Positive for neck pain.  All other systems reviewed and are negative.     Allergies  Review of patient's allergies indicates no known allergies.  Home Medications   Prior to Admission medications   Medication Sig Start Date End Date Taking? Authorizing Provider  cyclobenzaprine (FLEXERIL) 10 MG tablet Take 1 tablet (10 mg total) by mouth 2 (two) times daily as needed for muscle spasms. 04/30/15   Elpidio Anis, PA-C  naproxen (NAPROSYN) 500 MG tablet Take 1 tablet (500 mg total) by mouth 2 (two) times daily. 04/30/15   Shari Upstill, PA-C   BP 133/87 mmHg  Pulse 89  Temp(Src) 98.1 F (36.7 C) (Oral)  Resp 18  Ht  (1.88 m)  Wt 175 lb (79.379 kg)  BMI 22.46 kg/m2  SpO2 100% Physical Exam  Constitutional: He is oriented to person, place, and time. He appears well-developed and well-nourished. He appears distressed.  Appears uncomfortable  HENT:  Head: Normocephalic and  atraumatic.  Eyes: Conjunctivae are normal. Pupils are equal, round, and reactive to light.  Neck: No tracheal deviation present. No thyromegaly present.  No midline tenderness no JVD no bruit. He has pain at right paracervical area and over lying right trapezius area on rotation of the neck to the right. He also has pain at right trapezius area and right paracervical area on abduction of right shoulder. Grip strength 5 over 5 bilaterally  Cardiovascular: Normal rate and regular rhythm.   No murmur heard. Pulmonary/Chest: Effort normal and breath sounds normal.  Abdominal: Soft. Bowel sounds are normal. He exhibits no distension. There is no tenderness.  Musculoskeletal: Normal range of motion. He exhibits no edema or tenderness.  Neurological: He is alert and oriented to person, place, and time. No cranial nerve deficit. Coordination normal.  Gait normal motor strength 5 over 5 overall  Skin: Skin is warm and dry. No rash noted.  Psychiatric: He has a normal mood and affect.  Nursing note and vitals reviewed.   ED Course  Procedures (including critical care time) Labs Review Labs Reviewed - No data to display  Imaging Review No results found. I have personally reviewed and evaluated these images and lab results as part of my medical decision-making.   EKG Interpretation None     11:45 AM pain is improved after treatment with intravenous morphine. He feels ready to go home. MDM  Plan soft cervical collar, prescription Norco. An outpatient MRI scan of his cervical spine has been  arranged for 05/03/2015 at 2:30 PM. Result to be shown to EDP working here at that time Diagnosis cervical radiculopathy Final diagnoses:  None        Doug SouSam Payten Beaumier, MD 05/01/15 1149

## 2015-05-01 NOTE — ED Notes (Signed)
Approx one week on neck pain, worse with turning head and movement. Seen yesterday here and received prescriptions for flexeril and naproxyn. States Sx are worse today. Denies any known injury

## 2015-05-01 NOTE — ED Provider Notes (Signed)
CSN: [id number]     Arrival date & time 04/30/15  1229 History   First MD Initiated Contact with Patient 04/30/15 1258     Chief Complaint  Patient presents with  . Neck Pain     (Consider location/radiation/quality/duration/timing/severity/associated sxs/prior Treatment) Patient is a 25 y.o. male presenting with neck pain. The history is provided by the patient. No language interpreter was used.  Neck Pain Pain location:  R side Quality:  Stabbing Pain radiates to:  R shoulder Pain severity:  Severe Duration:  1 week Timing:  Constant Progression:  Worsening Chronicity:  New Associated symptoms: no fever, no numbness and no weakness   Associated symptoms comment:  The patient presents with complaint of severe right sided neck and shoulder pain over the last week. No known injury or trauma. No history of the same. He denies UE weakness, numbness. He is not dropping objects held in the right hand. The pain prevents him from being able to turn his head. Sitting up makes the pain worse. There is some pain while at full rest, lying down in certain positions.    History reviewed. No pertinent past medical history. Past Surgical History  Procedure Laterality Date  . Tonsillectomy     No family history on file. Social History  Substance Use Topics  . Smoking status: Current Every Day Smoker    Types: Cigarettes  . Smokeless tobacco: Never Used  . Alcohol Use: Yes     Comment: beer 1 x week    Review of Systems  Constitutional: Negative for fever and chills.  Respiratory: Negative.   Cardiovascular: Negative.   Gastrointestinal: Negative.   Musculoskeletal: Positive for neck pain.       See HPI  Skin: Negative.   Neurological: Negative.  Negative for weakness and numbness.      Allergies  Review of patient's allergies indicates no known allergies.  Home Medications   Prior to Admission medications   Medication Sig Start Date End Date Taking? Authorizing Provider   cyclobenzaprine (FLEXERIL) 10 MG tablet Take 1 tablet (10 mg total) by mouth 2 (two) times daily as needed for muscle spasms. 04/30/15   Elpidio Anis, PA-C  HYDROcodone-acetaminophen (NORCO) 5-325 MG tablet Take 1-2 tablets by mouth every 4 (four) hours as needed. 05/01/15   Doug Sou, MD  naproxen (NAPROSYN) 500 MG tablet Take 1 tablet (500 mg total) by mouth 2 (two) times daily. 04/30/15   Ildefonso Keaney, PA-C   BP 147/96 mmHg  Pulse 79  Temp(Src) 98.7 F (37.1 C) (Oral)  Resp 16  Ht  (1.88 m)  Wt 79.379 kg  BMI 22.46 kg/m2  SpO2 100% Physical Exam  Constitutional: He is oriented to person, place, and time. He appears well-developed and well-nourished.  Neck: Normal range of motion.  Cardiovascular: Intact distal pulses.   Pulmonary/Chest: Effort normal. He has no wheezes. He has no rales.  Musculoskeletal: Normal range of motion.  Tender to right paracervical neck without swelling. There is a palpable tense muscle c/w spasm. Soft trapezius that is tender, with tenderness that extends into right parascapular area. FROM of UE's. Full grip, biceps and triceps strength of right UE.  Neurological: He is alert and oriented to person, place, and time.  Skin: Skin is warm and dry.  Psychiatric: He has a normal mood and affect.    ED Course  Procedures (including critical care time) Labs Review Labs Reviewed - No data to display  Imaging Review No results found. I have  personally reviewed and evaluated these images and lab results as part of my medical decision-making.   EKG Interpretation None      MDM   Final diagnoses:  Neck pain  muscle spasm  The patient is having new onset neck pain without trauma or injury. No neurologic signs or symptoms. No weakness, swelling or midline cervical tenderness. Symptoms/exam c/w musculoskeletal pain. Treat with Naprosyn and Flexeril, warm compresses.    Elpidio AnisShari Jep Dyas, PA-C 05/01/15 2156  Azalia BilisKevin Campos, MD 05/02/15 [phone]

## 2015-05-02 ENCOUNTER — Other Ambulatory Visit (HOSPITAL_BASED_OUTPATIENT_CLINIC_OR_DEPARTMENT_OTHER): Payer: Self-pay | Admitting: Emergency Medicine

## 2015-05-02 DIAGNOSIS — R52 Pain, unspecified: Secondary | ICD-10-CM

## 2015-05-03 ENCOUNTER — Ambulatory Visit (HOSPITAL_BASED_OUTPATIENT_CLINIC_OR_DEPARTMENT_OTHER): Admit: 2015-05-03 | Payer: Self-pay

## 2017-02-27 ENCOUNTER — Emergency Department (HOSPITAL_BASED_OUTPATIENT_CLINIC_OR_DEPARTMENT_OTHER)
Admission: EM | Admit: 2017-02-27 | Discharge: 2017-02-27 | Disposition: A | Discharge status: Home / Self Care | Payer: Self-pay | Attending: Emergency Medicine | Admitting: Emergency Medicine

## 2017-02-27 ENCOUNTER — Other Ambulatory Visit: Payer: Self-pay

## 2017-02-27 ENCOUNTER — Encounter (HOSPITAL_BASED_OUTPATIENT_CLINIC_OR_DEPARTMENT_OTHER): Payer: Self-pay | Admitting: *Deleted

## 2017-02-27 DIAGNOSIS — F17210 Nicotine dependence, cigarettes, uncomplicated: Secondary | ICD-10-CM | POA: Insufficient documentation

## 2017-02-27 DIAGNOSIS — M436 Torticollis: Secondary | ICD-10-CM | POA: Insufficient documentation

## 2017-02-27 MED ORDER — HYDROCODONE-ACETAMINOPHEN 5-325 MG PO TABS: 1.0000 | ORAL_TABLET | ORAL | 0 refills | Status: AC | PRN

## 2017-02-27 MED ORDER — HYDROCODONE-ACETAMINOPHEN 5-325 MG PO TABS
1.0000 | ORAL_TABLET | Freq: Once | ORAL | Status: AC
  Administered 2017-02-27: 1 via ORAL
  Filled 2017-02-27: qty 1

## 2017-02-27 MED ORDER — KETOROLAC TROMETHAMINE 30 MG/ML IJ SOLN
30.0000 mg | Freq: Once | INTRAMUSCULAR | Status: AC
  Administered 2017-02-27: 30 mg via INTRAMUSCULAR
  Filled 2017-02-27: qty 1

## 2017-02-27 MED ORDER — IBUPROFEN 600 MG PO TABS: 600.0000 mg | ORAL_TABLET | Freq: Four times a day (QID) | ORAL | 0 refills | Status: AC | PRN

## 2017-02-27 MED ORDER — DIAZEPAM 5 MG PO TABS
5.0000 mg | ORAL_TABLET | Freq: Once | ORAL | Status: AC
  Administered 2017-02-27: 5 mg via ORAL
  Filled 2017-02-27: qty 1

## 2017-02-27 MED ORDER — DIAZEPAM 5 MG PO TABS: 5.0000 mg | ORAL_TABLET | Freq: Two times a day (BID) | ORAL | 0 refills | Status: AC

## 2017-02-27 NOTE — ED Triage Notes (Signed)
Pt sts he was moving some heavy pallets on Wed and began having neck pain on Thurs. He sts the pain has been getting progressively worse each day. Pt sts he feels some popping when he moves his head from side to side. Has tried heat, ice, ibuprofen, and lidocaine patches with no relief. He sts pain is a constant 8/10 with spikes to 10/10. He describes pain as starting in his right shoulder blade and shooting across the back of his neck and into his left shoulder.

## 2017-02-27 NOTE — ED Provider Notes (Signed)
[facility] EMERGENCY DEPARTMENT Provider Note   CSN: [id number] Arrival date & time: 02/27/17  1147     History   Chief Complaint Chief Complaint  Patient presents with  . Neck Pain    HPI Kenneth Lambert is a 27 y.o. male.  Pt presents to the ED today with neck pain.  Pt was moving some heavy pallets at work on Wed, Jan 30.  He woke up with severe neck pain the next day.  Pain has progressed so that he can't sleep or move his head.  Pt has tried otc meds without relief.  He denies numbness or tingling.      No past medical history on file.  There are no active problems to display for this patient.   Past Surgical History:  Procedure Laterality Date  . TONSILLECTOMY         Home Medications    Prior to Admission medications   Medication Sig Start Date End Date Taking? Authorizing Provider  cyclobenzaprine (FLEXERIL) 10 MG tablet Take 1 tablet (10 mg total) by mouth 2 (two) times daily as needed for muscle spasms. 04/30/15   Elpidio Anis, PA-C  diazepam (VALIUM) 5 MG tablet Take 1 tablet (5 mg total) by mouth 2 (two) times daily. 02/27/17   Jacalyn Lefevre, MD  HYDROcodone-acetaminophen (NORCO/VICODIN) 5-325 MG tablet Take 1 tablet by mouth every 4 (four) hours as needed. 02/27/17   Jacalyn Lefevre, MD  ibuprofen (ADVIL,MOTRIN) 600 MG tablet Take 1 tablet (600 mg total) by mouth every 6 (six) hours as needed. 02/27/17   Jacalyn Lefevre, MD  naproxen (NAPROSYN) 500 MG tablet Take 1 tablet (500 mg total) by mouth 2 (two) times daily. 04/30/15   Elpidio Anis, PA-C    Family History No family history on file.  Social History Social History   Tobacco Use  . Smoking status: Current Some Day Smoker    Types: Cigarettes  . Smokeless tobacco: Never Used  Substance Use Topics  . Alcohol use: Yes    Comment: beer 1 x week  . Drug use: No     Allergies   Patient has no known allergies.   Review of Systems Review of Systems  Musculoskeletal: Positive for  neck pain.  All other systems reviewed and are negative.    Physical Exam Updated Vital Signs BP 131/80 (BP Location: Right Arm)   Pulse 70   Temp 98.2 F (36.8 C) (Oral)   Resp 18   Ht 6\' 2"  (1.88 m)   Wt 86.2 kg (190 lb)   SpO2 100%   BMI 24.39 kg/m   Physical Exam  Constitutional: He is oriented to person, place, and time. He appears well-developed and well-nourished.  HENT:  Head: Normocephalic and atraumatic.  Right Ear: External ear normal.  Left Ear: External ear normal.  Nose: Nose normal.  Mouth/Throat: Oropharynx is clear and moist.  Eyes: Conjunctivae and EOM are normal. Pupils are equal, round, and reactive to light.  Neck: Muscular tenderness present.  Cardiovascular: Normal rate, regular rhythm, normal heart sounds and intact distal pulses.  Pulmonary/Chest: Effort normal and breath sounds normal.  Abdominal: Soft. Bowel sounds are normal.  Musculoskeletal: Normal range of motion.  Neurological: He is alert and oriented to person, place, and time.  Skin: Skin is warm. Capillary refill takes less than 2 seconds.  Psychiatric: He has a normal mood and affect. His behavior is normal. Judgment and thought content normal.  Nursing note and vitals reviewed.  ED Treatments / Results  Labs (all labs ordered are listed, but only abnormal results are displayed) Labs Reviewed - No data to display  EKG  EKG Interpretation None       Radiology No results found.  Procedures Procedures (including critical care time)  Medications Ordered in ED Medications  ketorolac (TORADOL) 30 MG/ML injection 30 mg (not administered)  diazepam (VALIUM) tablet 5 mg (not administered)  HYDROcodone-acetaminophen (NORCO/VICODIN) 5-325 MG per tablet 1 tablet (not administered)     Initial Impression / Assessment and Plan / ED Course  I have reviewed the triage vital signs and the nursing notes.  Pertinent labs & imaging results that were available during my care of the  patient were reviewed by me and considered in my medical decision making (see chart for details).     Pain is all muscular with no neurologic complaints.  Pt to be d/c with valium, lortab, and ibuprofen.  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  Return if worse.  Final Clinical Impressions(s) / ED Diagnoses   Final diagnoses:  Torticollis, acute    ED Discharge Orders        Ordered    diazepam (VALIUM) 5 MG tablet  2 times daily     02/27/17 1524    HYDROcodone-acetaminophen (NORCO/VICODIN) 5-325 MG tablet  Every 4 hours PRN     02/27/17 1524    ibuprofen (ADVIL,MOTRIN) 600 MG tablet  Every 6 hours PRN     02/27/17 1524       Jacalyn LefevreHaviland, Corion Sherrod, MD 02/27/17 1533

## 2021-07-22 ENCOUNTER — Encounter (HOSPITAL_BASED_OUTPATIENT_CLINIC_OR_DEPARTMENT_OTHER): Payer: Self-pay

## 2021-07-22 ENCOUNTER — Emergency Department (HOSPITAL_BASED_OUTPATIENT_CLINIC_OR_DEPARTMENT_OTHER)
Admission: EM | Admit: 2021-07-22 | Discharge: 2021-07-22 | Disposition: A | Discharge status: Home / Self Care | Payer: BC Managed Care – PPO | Attending: Emergency Medicine | Admitting: Emergency Medicine

## 2021-07-22 ENCOUNTER — Emergency Department (HOSPITAL_BASED_OUTPATIENT_CLINIC_OR_DEPARTMENT_OTHER): Payer: BC Managed Care – PPO

## 2021-07-22 ENCOUNTER — Other Ambulatory Visit: Payer: Self-pay

## 2021-07-22 DIAGNOSIS — R200 Anesthesia of skin: Secondary | ICD-10-CM

## 2021-07-22 DIAGNOSIS — R42 Dizziness and giddiness: Secondary | ICD-10-CM | POA: Insufficient documentation

## 2021-07-22 DIAGNOSIS — R202 Paresthesia of skin: Secondary | ICD-10-CM | POA: Insufficient documentation

## 2021-07-22 LAB — CBC WITH DIFFERENTIAL/PLATELET
Abs Immature Granulocytes: 0.03 10*3/uL (ref 0.00–0.07)
Basophils Absolute: 0 10*3/uL (ref 0.0–0.1)
Basophils Relative: 1 %
Eosinophils Absolute: 0 10*3/uL (ref 0.0–0.5)
Eosinophils Relative: 1 %
HCT: 46 % (ref 39.0–52.0)
Hemoglobin: 15.1 g/dL (ref 13.0–17.0)
Immature Granulocytes: 0 %
Lymphocytes Relative: 30 %
Lymphs Abs: 2.1 10*3/uL (ref 0.7–4.0)
MCH: 29.3 pg (ref 26.0–34.0)
MCHC: 32.8 g/dL (ref 30.0–36.0)
MCV: 89.1 fL (ref 80.0–100.0)
Monocytes Absolute: 0.6 10*3/uL (ref 0.1–1.0)
Monocytes Relative: 9 %
Neutro Abs: 4.3 10*3/uL (ref 1.7–7.7)
Neutrophils Relative %: 59 %
Platelets: 275 10*3/uL (ref 150–400)
RBC: 5.16 MIL/uL (ref 4.22–5.81)
RDW: 12.5 % (ref 11.5–15.5)
WBC: 7.1 10*3/uL (ref 4.0–10.5)
nRBC: 0 % (ref 0.0–0.2)

## 2021-07-22 LAB — BASIC METABOLIC PANEL
Anion gap: 7 (ref 5–15)
BUN: 14 mg/dL (ref 6–20)
CO2: 24 mmol/L (ref 22–32)
Calcium: 9.7 mg/dL (ref 8.9–10.3)
Chloride: 105 mmol/L (ref 98–111)
Creatinine, Ser: 0.93 mg/dL (ref 0.61–1.24)
GFR, Estimated: >60 mL/min (ref >60)
Glucose, Bld: 118 mg/dL — ABNORMAL HIGH (ref 70–99)
Potassium: 3.9 mmol/L (ref 3.5–5.1)
Sodium: 136 mmol/L (ref 135–145)

## 2021-07-22 MED ORDER — HYDROXYZINE HCL 25 MG PO TABS: 25.0000 mg | ORAL_TABLET | Freq: Four times a day (QID) | ORAL | 0 refills | Status: AC

## 2021-07-22 NOTE — ED Provider Notes (Signed)
[facility] EMERGENCY DEPARTMENT Provider Note   CSN: [id number] Arrival date & time: 07/22/21  0909     History Chief Complaint  Patient presents with   Dizziness   Numbness    Kenneth Lambert is a 31 y.o. male with no significant past medical history who presents to the emergency department with intermittent numbness to the left side of face and bilateral hands.  This started on Monday and the first episode lasted 3 to 4 hours.  Patient had another episode yesterday that lasted roughly 1 hour.  He states that when these episodes happen, he gets very short of breath and noticed that his breathing is quite shallow and labored in addition to some lightheadedness.  He denies any chest pain, syncope, facial droop.  Patient states that there was questionable slurred speech during the first episode.  He denies any increase stressors, difficulty walking.   Dizziness      Home Medications Prior to Admission medications   Medication Sig Start Date End Date Taking? Authorizing Provider  hydrOXYzine (ATARAX) 25 MG tablet Take 1 tablet (25 mg total) by mouth every 6 (six) hours. 07/22/21  Yes Meredeth Ide, Caro Brundidge M, PA-C  cyclobenzaprine (FLEXERIL) 10 MG tablet Take 1 tablet (10 mg total) by mouth 2 (two) times daily as needed for muscle spasms. 04/30/15   Elpidio Anis, PA-C  diazepam (VALIUM) 5 MG tablet Take 1 tablet (5 mg total) by mouth 2 (two) times daily. 02/27/17   Jacalyn Lefevre, MD  HYDROcodone-acetaminophen (NORCO/VICODIN) 5-325 MG tablet Take 1 tablet by mouth every 4 (four) hours as needed. 02/27/17   Jacalyn Lefevre, MD  ibuprofen (ADVIL,MOTRIN) 600 MG tablet Take 1 tablet (600 mg total) by mouth every 6 (six) hours as needed. 02/27/17   Jacalyn Lefevre, MD  naproxen (NAPROSYN) 500 MG tablet Take 1 tablet (500 mg total) by mouth 2 (two) times daily. 04/30/15   Elpidio Anis, PA-C      Allergies    Patient has no known allergies.    Review of Systems   Review of Systems   Neurological:  Positive for dizziness.  All other systems reviewed and are negative.   Physical Exam Updated Vital Signs BP (!) 140/95   Pulse 73   Temp 98.9 F (37.2 C)   Resp 16   Ht 6\' 2"  (1.88 m)   Wt 94.3 kg   SpO2 96%   BMI 26.69 kg/m  Physical Exam Vitals and nursing note reviewed.  Constitutional:      General: He is not in acute distress.    Appearance: Normal appearance.  HENT:     Head: Normocephalic and atraumatic.  Eyes:     General:        Right eye: No discharge.        Left eye: No discharge.  Cardiovascular:     Comments: Regular rate and rhythm.  S1/S2 are distinct without any evidence of murmur, rubs, or gallops.  Radial pulses are 2+ bilaterally.  Dorsalis pedis pulses are 2+ bilaterally.  No evidence of pedal edema. Pulmonary:     Comments: Clear to auscultation bilaterally.  Normal effort.  No respiratory distress.  No evidence of wheezes, rales, or rhonchi heard throughout. Abdominal:     General: Abdomen is flat. Bowel sounds are normal. There is no distension.     Tenderness: There is no abdominal tenderness. There is no guarding or rebound.  Musculoskeletal:        General: Normal range of motion.  Cervical back: Neck supple.  Skin:    General: Skin is warm and dry.     Findings: No rash.  Neurological:     General: No focal deficit present.     Mental Status: He is alert.     Comments: Cranial nerves II through XII are intact apart from some slight subjective decreased sensation on the left side of the face.  Extraocular movements are intact without any evidence of entrapment or pain.  5/5 strength to the upper and lower extremities.  Normal sensation to the upper and lower extremities.  No dysmetria to finger-nose.  No pronator drift.  Psychiatric:        Mood and Affect: Mood normal.        Behavior: Behavior normal.     ED Results / Procedures / Treatments   Labs (all labs ordered are listed, but only abnormal results are  displayed) Labs Reviewed  BASIC METABOLIC PANEL - Abnormal; Notable for the following components:      Result Value   Glucose, Bld 118 (*)    All other components within normal limits  CBC WITH DIFFERENTIAL/PLATELET    EKG None  Radiology No results found.  Procedures Procedures    Medications Ordered in ED Medications - No data to display  ED Course/ Medical Decision Making/ A&P Clinical Course as of 07/22/21 1048  Wed Jul 22, 2021  1041 CLINICAL DATA: Trunk numbness or tingling numbness  EXAM: CT HEAD WITHOUT CONTRAST  TECHNIQUE: Contiguous axial images were obtained from the base of the skull through the vertex without intravenous contrast.  RADIATION DOSE REDUCTION: This exam was performed according to the departmental dose-optimization program which includes automated exposure control, adjustment of the mA and/or kV according to patient size and/or use of iterative reconstruction technique.  COMPARISON: None Available.  FINDINGS: Brain: No evidence of acute intracranial hemorrhage or extra-axial collection.No evidence of mass lesion/concerning mass effect.The ventricles are normal in size.  Vascular: No hyperdense vessel or unexpected calcification.  Skull: Negative.  Sinuses/Orbits: No acute finding.  Other: None.  IMPRESSION: No acute intracranial abnormality.   Electronically Signed By: Caprice Renshaw M.D. On: 07/22/2021 10:10   [CF]  1042 On reevaluation, patient remains asymptomatic and his numbness has completely resolved. [CF]    Clinical Course User Index [CF] Teressa Lower, PA-C                           Medical Decision Making [name] is a 31 y.o. male patient who presents to the emergency department with intermittent bilateral hand numbness in addition to numbness to left side of the face.  His symptoms seem to be consistent with anxiety and/or panic attacks.  He has no formal diagnosis of anxiety nor does he take any  medication for anxiety.  Neuro exam is reassuring.  We will get basic labs in addition to CT head for further evaluation. Patient outside stroke window and is negative for LVO.   Amount and/or Complexity of Data Reviewed Labs: ordered. Decision-making details documented in ED Course.    Details: I personally ordered and interpreted CBC and CMP did not reveal any significant abnormalities. Radiology: ordered and independent interpretation performed.    Details: I personally ordered and interpreted a CT head without contrast.  I do not see any space-occupying lesions or evidence of intracranial hemorrhage.  No acute stroke evidence that I could see.  I do agree with the radiologist interpretation.  Risk Decision regarding hospitalization. Risk Details: Patient remains asymptomatic.  Symptoms have been ongoing for 2 days intermittently and this was acute stroke it would be evident on CT.  CT head was normal.  I suspect this is likely anxiety or panic attacks.  I discussed findings of labs and imaging with the patient at the bedside.  Going to prescribe him Atarax that he can take for the symptoms to see if they help.  I am going to have him follow-up with his PCP for further evaluation.  Strict turn precautions were discussed.  He is safe for discharge.   Final Clinical Impression(s) / ED Diagnoses Final diagnoses:  Numbness    Rx / DC Orders ED Discharge Orders          Ordered    hydrOXYzine (ATARAX) 25 MG tablet  Every 6 hours        07/22/21 1046              Honor Loh [place], [place] 07/22/21 1048    Benjiman Core, MD 07/22/21 1514

## 2021-07-22 NOTE — ED Notes (Signed)
Pt to CT

## 2021-07-22 NOTE — ED Triage Notes (Signed)
Pt reports intermittent tingling/numbness in bilateral hands, lightheadedness, and some slurred speech since Monday. Pt states he is also really anxious.

## 2021-07-22 NOTE — Discharge Instructions (Signed)
There is no evidence of stroke on your images today which is great news.  I suspect this is likely a manifestation of anxiety.  I am going to prescribe you hydroxyzine which is a nonaddictive medication that can help with these episodes.  If this is anxiety related this medication should help.  I would like for you to follow-up with your primary care doctor for further evaluation or return to the emergency department for any worsening symptoms.
